# Patient Record
Sex: Female | Born: 1990 | Race: White | Hispanic: No | Marital: Married | Smoking: Never smoker
Health system: Southern US, Community
[De-identification: ages and names within clinical notes are randomized; demographics above are authoritative.]

## PROBLEM LIST (undated history)

## (undated) DIAGNOSIS — N83209 Unspecified ovarian cyst, unspecified side: Secondary | ICD-10-CM

## (undated) DIAGNOSIS — N898 Other specified noninflammatory disorders of vagina: Secondary | ICD-10-CM

## (undated) DIAGNOSIS — Z9289 Personal history of other medical treatment: Secondary | ICD-10-CM

## (undated) DIAGNOSIS — N390 Urinary tract infection, site not specified: Secondary | ICD-10-CM

## (undated) DIAGNOSIS — Z803 Family history of malignant neoplasm of breast: Secondary | ICD-10-CM

## (undated) HISTORY — DX: Unspecified ovarian cyst, unspecified side: N83.209

## (undated) HISTORY — DX: Urinary tract infection, site not specified: N39.0

## (undated) HISTORY — DX: Personal history of other medical treatment: Z92.89

## (undated) HISTORY — DX: Family history of malignant neoplasm of breast: Z80.3

## (undated) HISTORY — DX: Other specified noninflammatory disorders of vagina: N89.8

---
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
## 2013-09-21 ENCOUNTER — Observation Stay: Payer: Self-pay

## 2013-09-25 ENCOUNTER — Inpatient Hospital Stay: Payer: Self-pay | Admitting: Obstetrics & Gynecology

## 2013-09-25 LAB — CBC WITH DIFFERENTIAL/PLATELET
BASOS ABS: 0 10*3/uL (ref 0.0–0.1)
Basophil %: 0.4 %
Eosinophil #: 0.1 10*3/uL (ref 0.0–0.7)
Eosinophil %: 0.6 %
HCT: 40 % (ref 35.0–47.0)
HGB: 13.8 g/dL (ref 12.0–16.0)
Lymphocyte #: 2 10*3/uL (ref 1.0–3.6)
Lymphocyte %: 15.6 %
MCH: 31.5 pg (ref 26.0–34.0)
MCHC: 34.5 g/dL (ref 32.0–36.0)
MCV: 91 fL (ref 80–100)
MONOS PCT: 8.1 %
Monocyte #: 1 x10 3/mm — ABNORMAL HIGH (ref 0.2–0.9)
NEUTROS ABS: 9.6 10*3/uL — AB (ref 1.4–6.5)
Neutrophil %: 75.3 %
PLATELETS: 139 10*3/uL — AB (ref 150–440)
RBC: 4.38 10*6/uL (ref 3.80–5.20)
RDW: 13.3 % (ref 11.5–14.5)
WBC: 12.8 10*3/uL — AB (ref 3.6–11.0)

## 2013-09-25 LAB — GC/CHLAMYDIA PROBE AMP

## 2013-09-26 LAB — HEMATOCRIT: HCT: 32.7 % — ABNORMAL LOW (ref 35.0–47.0)

## 2014-04-04 DIAGNOSIS — Z803 Family history of malignant neoplasm of breast: Secondary | ICD-10-CM

## 2014-04-04 HISTORY — DX: Family history of malignant neoplasm of breast: Z80.3

## 2014-04-30 ENCOUNTER — Emergency Department: Payer: Self-pay | Admitting: Emergency Medicine

## 2014-04-30 LAB — CBC
HCT: 41.9 % (ref 35.0–47.0)
HGB: 13.8 g/dL (ref 12.0–16.0)
MCH: 29.7 pg (ref 26.0–34.0)
MCHC: 32.8 g/dL (ref 32.0–36.0)
MCV: 91 fL (ref 80–100)
Platelet: 195 10*3/uL (ref 150–440)
RBC: 4.62 10*6/uL (ref 3.80–5.20)
RDW: 13.3 % (ref 11.5–14.5)
WBC: 7.1 10*3/uL (ref 3.6–11.0)

## 2014-04-30 LAB — HCG, QUANTITATIVE, PREGNANCY: Beta Hcg, Quant.: 6807 m[IU]/mL — ABNORMAL HIGH

## 2014-06-04 NOTE — L&D Delivery Note (Signed)
VAGINAL DELIVERY NOTE:  Date of Delivery: 04/30/2015 Primary OB: [facility]  Gestational Age/EDD: 2055w3d 05/11/2015, by Other Basis Antepartum complications: none Attending Physician: Annamarie MajorPaul Harris, MD, FACOG Delivery Type: spontaneous vaginal delivery  Anesthesia:   Epidural Laceration: none Episiotomy: none Placenta: manual removal Intrapartum complications: None Estimated Blood Loss: Min, < 100 GBS: neg Procedure Details: Routine Baby: Liveborn female, Apgars 8/9

## 2014-10-12 NOTE — H&P (Signed)
L&D Evaluation:  History:  HPI 24 year old G1 P0 with EDC=10/02/2013 by a LMP=12/26/2012 and c/w a 9wk4d ultrasound,  presents with contractions q6 min and leakage of fluid. Contractions started at work and they seem to have spaced out since coming to the hospital. Baby active. Is schedule for IOL on 4/24 for ACD (4/80%) since she lives in [place] (one hour away). [facility] remarkable for spotting in the first trimester, negative genetic testing, and a normal anatomy scan. LABS: APOS, RI, VI,   Presents with contractions, leaking fluid   Patient's Medical History No Chronic Illness   Patient's Surgical History Reduction mammoplasty   Medications Pre Natal Vitamins   Allergies NKDA   Social History none   Family History Non-Contributory   ROS:  ROS see HPI   Exam:  Vital Signs stable   General no apparent distress   Mental Status clear   Abdomen gravid, non-tender   Estimated Fetal Weight Average for gestational age   Fetal Position cephalic   Pelvic no external lesions, SSE: small amt white discharge. nitrazine negative. fern negative.. 4/90%/-1   Mebranes Intact   FHT normal rate with no decels, CAt 1   Ucx q10-15 min apart   Skin dry   Impression:  Impression reactive NST, IUP at 38 3/7 weeks. Not in labor. No evidence of SROM   Plan:  Plan DC home with labor precautions.   Electronic Signatures: Trinna BalloonGutierrez, Cherylanne Ardelean L (CNM)  (Signed 20-Apr-15 20:09)  Authored: L&D Evaluation   Last Updated: 20-Apr-15 20:09 by Trinna BalloonGutierrez, Nakia Remmers L (CNM)

## 2014-10-12 NOTE — H&P (Signed)
L&D Evaluation:  History Expanded:  HPI 24 year old G1 P0 with EDC=10/02/2013 by a LMP=12/26/2012 and c/w a 9wk4d ultrasound,  presents with contractions. Contractions reg, no spontaneous rupture of membranes or vaginal bleeding. Baby active. Is schedule for IOL on 4/24 for ACD (4/80%) since she lives in [place] (one hour away). [facility] remarkable for spotting in the first trimester, negative genetic testing, and a normal anatomy scan. LABS: APOS, RI, VI,   Gravida 1   Term 0   Blood Type (Maternal) A positive   Maternal Varicella Immune   Rubella Results (Maternal) immune   EDC 02-Oct-2013   Presents with contractions   Patient's Medical History No Chronic Illness   Patient's Surgical History Reduction mammoplasty   Medications Pre Natal Vitamins   Allergies NKDA   Social History none   Family History Non-Contributory   ROS:  ROS see HPI   Exam:  Vital Signs stable   General no apparent distress   Mental Status clear   Chest clear   Heart normal sinus rhythm   Abdomen gravid, non-tender   Estimated Fetal Weight Average for gestational age   Fetal Position cephalic   Back no CVAT   Edema no edema   Pelvic no external lesions, 9 cm   Mebranes Intact   FHT normal rate with no decels, CAt 1   Ucx regular   Skin dry   Impression:  Impression active labor, reactive NST   Plan:  Plan EFM/NST, monitor contractions and for cervical change, fluids   Comments analgesia   Electronic Signatures: Letitia LibraHarris, Dolce Sylvia Paul (MD)  (Signed 24-Apr-15 01:39)  Authored: L&D Evaluation   Last Updated: 24-Apr-15 01:39 by Letitia LibraHarris, Naylin Burkle Paul (MD)

## 2015-04-23 IMAGING — US US OB < 14 WEEKS - US OB TV
1 series · 13 of 28 positions shown · non-contrast
Comparison: None.

CLINICAL DATA: Vaginal bleeding.  Positive for pregnancy.

EXAM:
OBSTETRIC <14 WK US AND TRANSVAGINAL OB US
TECHNIQUE: Both transabdominal and transvaginal ultrasound examinations were
performed for complete evaluation of the gestation as well as the
maternal uterus, adnexal regions, and pelvic cul-de-sac.
Transvaginal technique was performed to assess early pregnancy.

[Series 1: us ob < 14 weeks - us ob tv · 0.18mm/px · 13 of 84 slices shown]
[im 4/84]
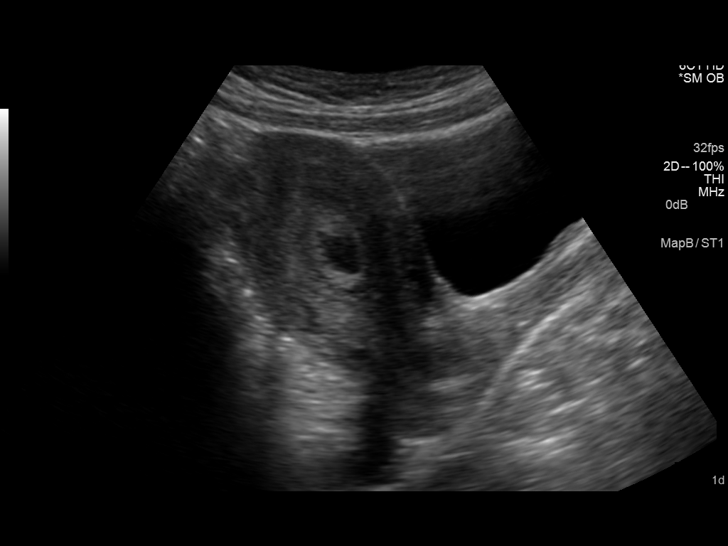
[im 10/84]
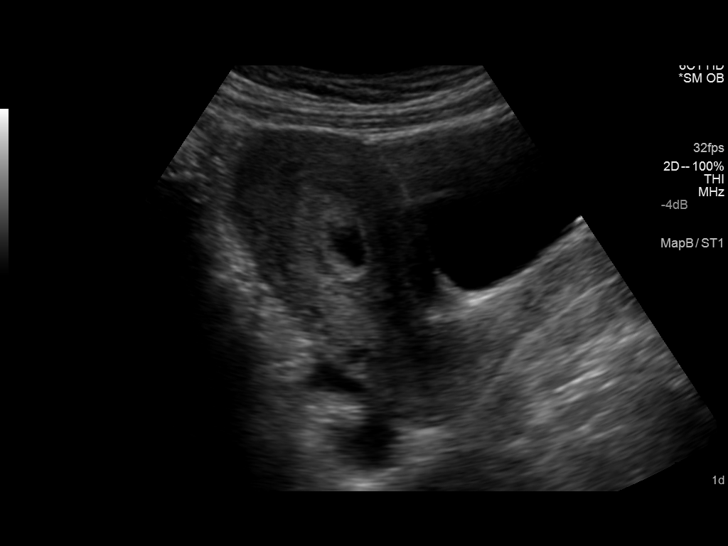
[im 16/84]
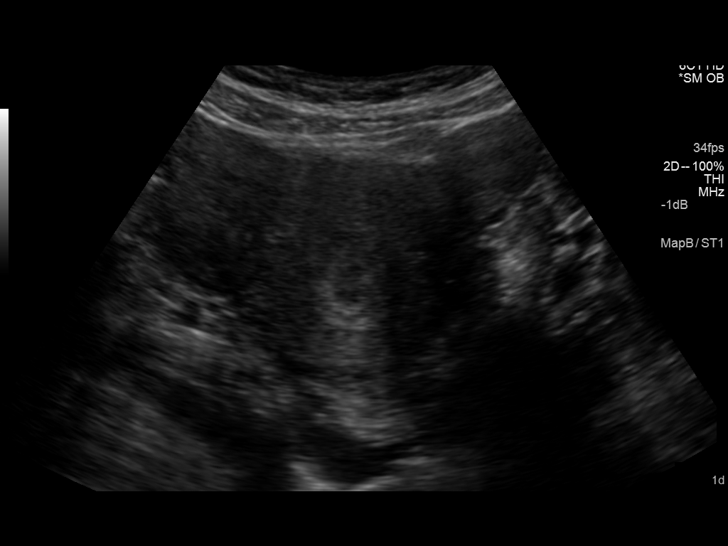
[im 22/84]
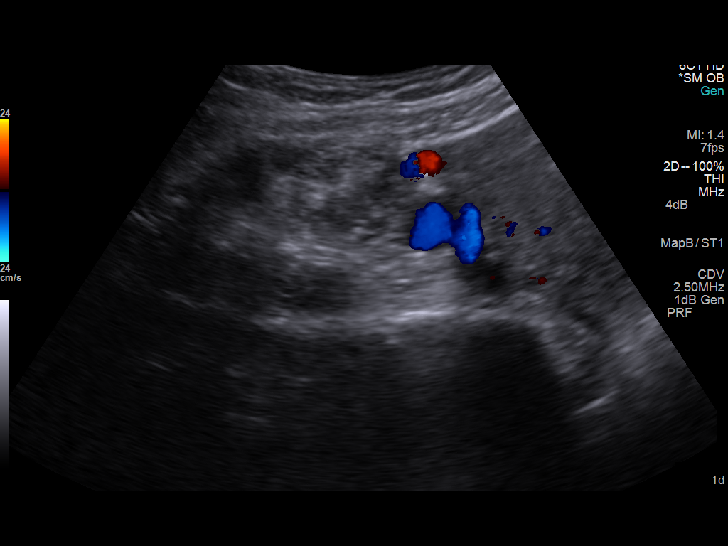
[im 28/84]
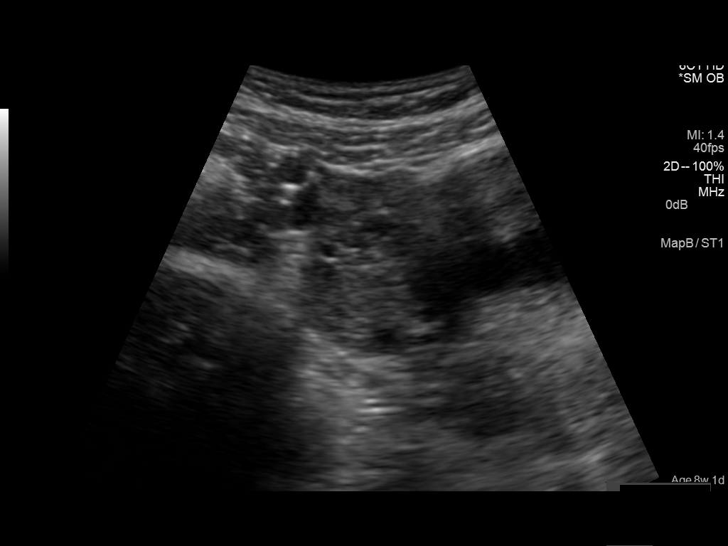
[im 34/84]
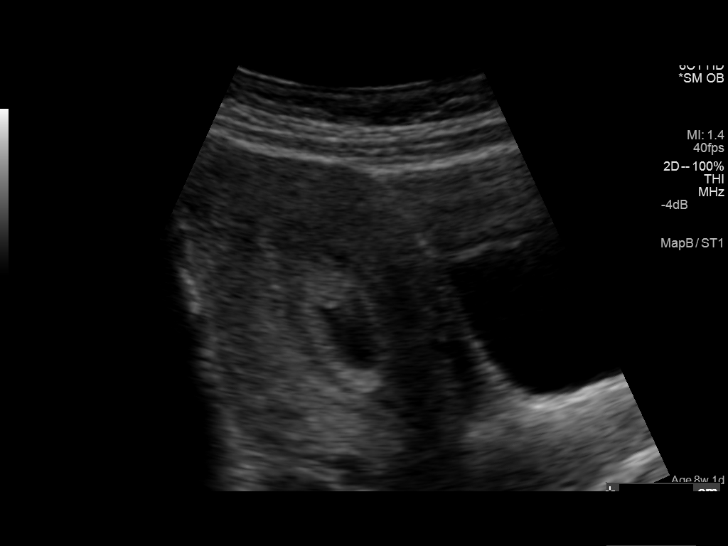
[im 44/84]
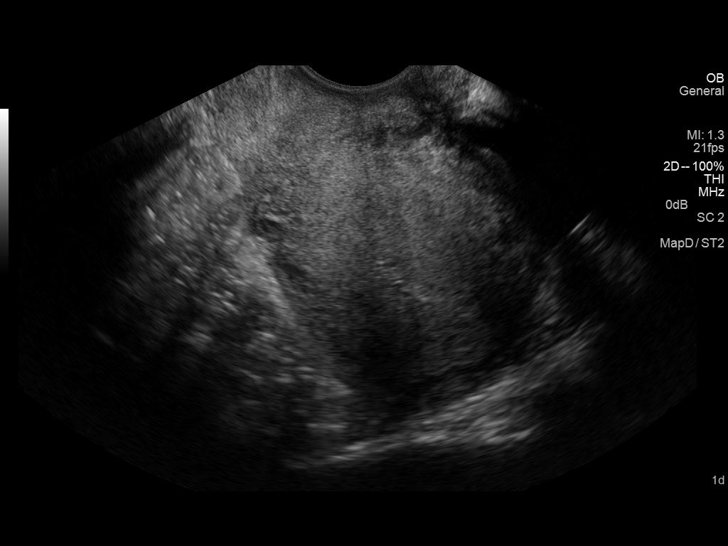
[im 50/84]
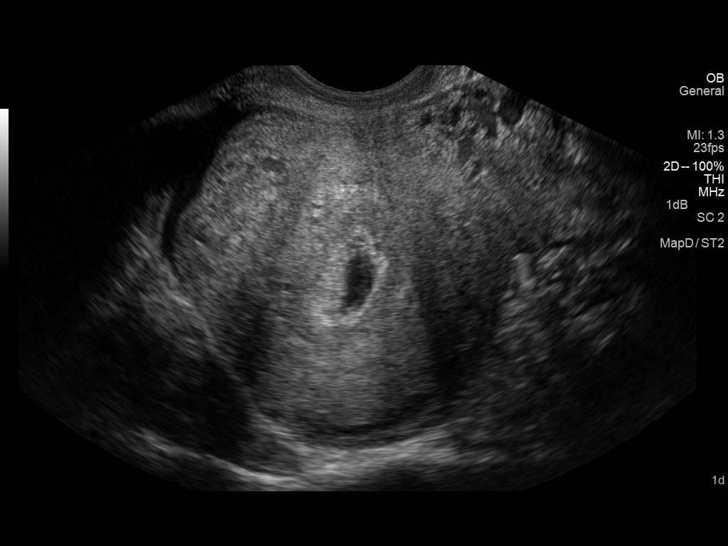
[im 56/84]
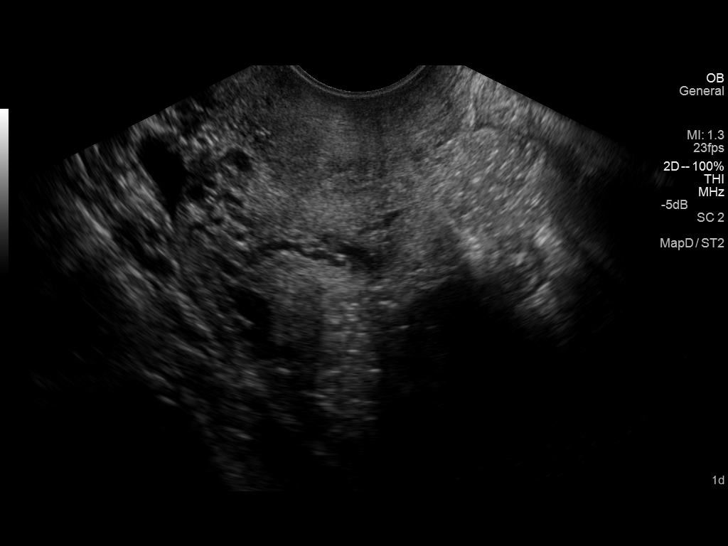
[im 62/84]
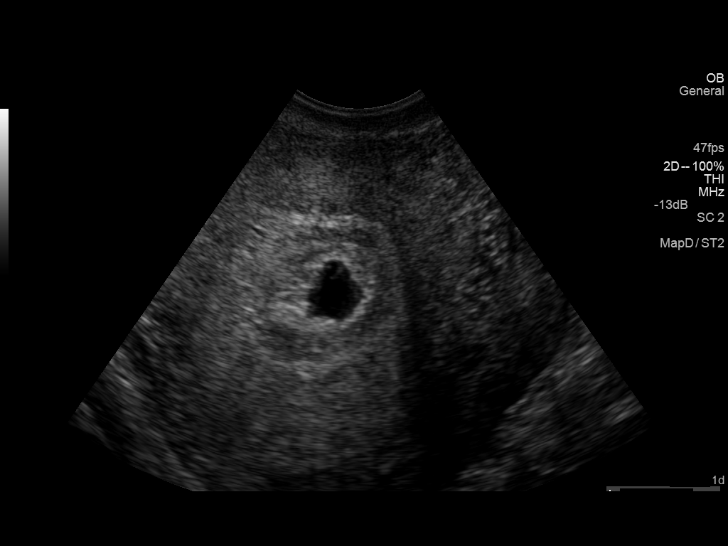
[im 68/84]
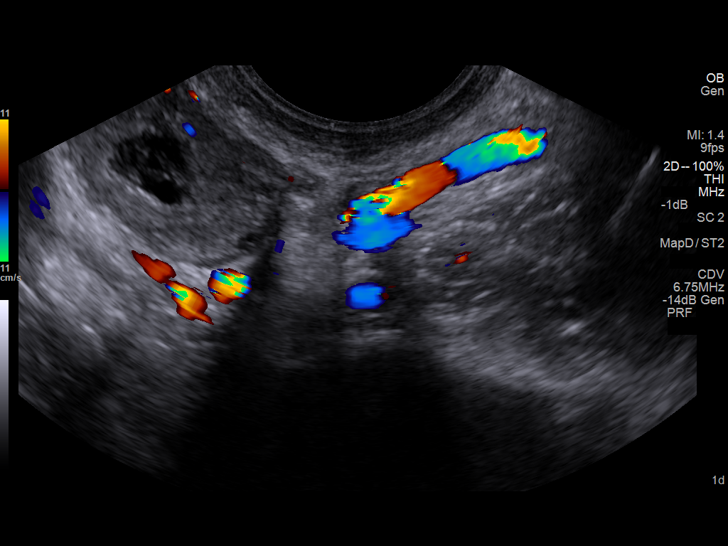
[im 74/84]
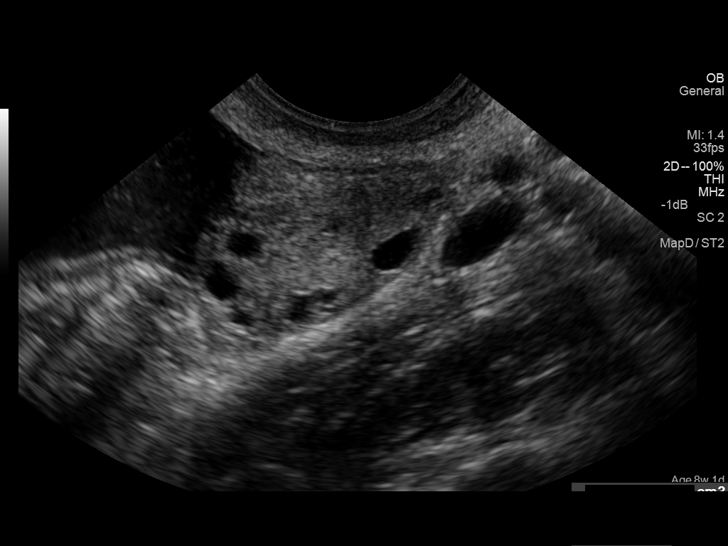
[im 80/84]
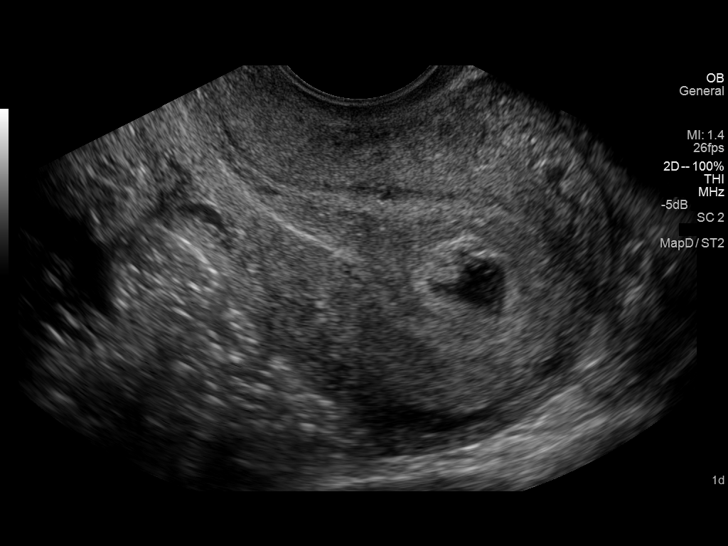

[13 of 28 positions shown; findings below may reference images not displayed]

FINDINGS: Intrauterine gestational sac: Present

Yolk sac:  None

Embryo:  None

MSD:  9.4  mm   5 w   5  d

US EDC: 12/26/2014

Maternal uterus/adnexae: There is an intrauterine gestational sac.
The margins of the gestational sac are irregular. No evidence for an
embryo or yolk sac. Difficult to exclude echogenic debris within the
gestational sac. No evidence for subchorionic hemorrhage. Small
amount of free fluid. Right ovary measures 2.3 x 1.6 x 2.5 cm. Small
hypoechoic structure within the right ovary measures up to 1.1 cm
with some internal echoes. Left ovary measures 2.8 x 1.4 x 2.3 cm
with small follicles.
IMPRESSION: Evidence for an intrauterine gestational sac. Calculated gestational
age is 5 weeks and 5 days. However, the margins of the gestational
sac are irregular which raises concern for the viability of this
pregnancy.

Small amount of free fluid.

## 2015-04-30 ENCOUNTER — Inpatient Hospital Stay: Payer: BLUE CROSS/BLUE SHIELD | Admitting: Anesthesiology

## 2015-04-30 ENCOUNTER — Encounter: Payer: Self-pay | Admitting: *Deleted

## 2015-04-30 ENCOUNTER — Inpatient Hospital Stay
Admission: EM | Admit: 2015-04-30 | Discharge: 2015-05-02 | DRG: 775 | Disposition: A | Discharge status: Home / Self Care | Payer: BLUE CROSS/BLUE SHIELD | Attending: Obstetrics & Gynecology | Admitting: Obstetrics & Gynecology

## 2015-04-30 DIAGNOSIS — Z3A38 38 weeks gestation of pregnancy: Secondary | ICD-10-CM

## 2015-04-30 DIAGNOSIS — Z9889 Other specified postprocedural states: Secondary | ICD-10-CM

## 2015-04-30 LAB — CBC
HEMATOCRIT: 34.3 % — AB (ref 35.0–47.0)
Hemoglobin: 11.1 g/dL — ABNORMAL LOW (ref 12.0–16.0)
MCH: 27.7 pg (ref 26.0–34.0)
MCHC: 32.3 g/dL (ref 32.0–36.0)
MCV: 85.8 fL (ref 80.0–100.0)
PLATELETS: 136 10*3/uL — AB (ref 150–440)
RBC: 3.99 MIL/uL (ref 3.80–5.20)
RDW: 14.5 % (ref 11.5–14.5)
WBC: 13.9 10*3/uL — AB (ref 3.6–11.0)

## 2015-04-30 LAB — ABO/RH: ABO/RH(D): A POS

## 2015-04-30 MED ORDER — BUPIVACAINE HCL (PF) 0.25 % IJ SOLN
INTRAMUSCULAR | Status: DC | PRN
  Administered 2015-04-30: 3 mL via EPIDURAL

## 2015-04-30 MED ORDER — OXYTOCIN 40 UNITS IN LACTATED RINGERS INFUSION - SIMPLE MED
62.5000 mL/h | INTRAVENOUS | Status: DC
  Administered 2015-04-30: 62.5 mL/h via INTRAVENOUS
  Administered 2015-04-30: 40 [IU] via INTRAVENOUS

## 2015-04-30 MED ORDER — MISOPROSTOL 200 MCG PO TABS
ORAL_TABLET | ORAL | Status: AC
  Filled 2015-04-30: qty 4

## 2015-04-30 MED ORDER — FENTANYL 2.5 MCG/ML W/ROPIVACAINE 0.2% IN NS 100 ML EPIDURAL INFUSION (ARMC-ANES)
EPIDURAL | Status: AC
  Administered 2015-04-30: 10 mL/h via EPIDURAL
  Filled 2015-04-30: qty 100

## 2015-04-30 MED ORDER — OXYTOCIN 10 UNIT/ML IJ SOLN
INTRAMUSCULAR | Status: AC
  Filled 2015-04-30: qty 2

## 2015-04-30 MED ORDER — BUTORPHANOL TARTRATE 1 MG/ML IJ SOLN: 1.0000 mg | INTRAMUSCULAR | Status: DC | PRN

## 2015-04-30 MED ORDER — LACTATED RINGERS IV SOLN
INTRAVENOUS | Status: DC
Start: 2015-04-30 — End: 2015-05-01

## 2015-04-30 MED ORDER — AMMONIA AROMATIC IN INHA
RESPIRATORY_TRACT | Status: AC
  Filled 2015-04-30: qty 10

## 2015-04-30 MED ORDER — ONDANSETRON HCL 4 MG/2ML IJ SOLN: 4.0000 mg | Freq: Four times a day (QID) | INTRAMUSCULAR | Status: DC | PRN

## 2015-04-30 MED ORDER — ACETAMINOPHEN 325 MG PO TABS: 650.0000 mg | ORAL_TABLET | ORAL | Status: DC | PRN

## 2015-04-30 MED ORDER — OXYTOCIN 40 UNITS IN LACTATED RINGERS INFUSION - SIMPLE MED
INTRAVENOUS | Status: AC
  Administered 2015-04-30: 40 [IU] via INTRAVENOUS
  Filled 2015-04-30: qty 1000

## 2015-04-30 MED ORDER — LIDOCAINE HCL (PF) 1 % IJ SOLN
INTRAMUSCULAR | Status: AC
  Filled 2015-04-30: qty 30

## 2015-04-30 MED ORDER — LACTATED RINGERS IV SOLN
500.0000 mL | INTRAVENOUS | Status: DC | PRN
  Administered 2015-04-30: 500 mL via INTRAVENOUS

## 2015-04-30 MED ORDER — OXYTOCIN BOLUS FROM INFUSION: 500.0000 mL | INTRAVENOUS | Status: DC

## 2015-04-30 NOTE — Anesthesia Preprocedure Evaluation (Signed)
Anesthesia Evaluation  Patient identified by MRN, date of birth, ID band Patient awake    Reviewed: Allergy & Precautions, NPO status , Patient's Chart, lab work & pertinent test results, reviewed documented beta blocker date and time   Airway Mallampati: II  TM Distance: >3 FB     Dental  (+) Chipped   Pulmonary           Cardiovascular      Neuro/Psych    GI/Hepatic   Endo/Other    Renal/GU      Musculoskeletal   Abdominal   Peds  Hematology   Anesthesia Other Findings   Reproductive/Obstetrics                             Anesthesia Physical Anesthesia Plan  ASA: II  Anesthesia Plan: Epidural   Post-op Pain Management:    Induction:   Airway Management Planned:   Additional Equipment:   Intra-op Plan:   Post-operative Plan:   Informed Consent: I have reviewed the patients History and Physical, chart, labs and discussed the procedure including the risks, benefits and alternatives for the proposed anesthesia with the patient or authorized representative who has indicated his/her understanding and acceptance.     Plan Discussed with: CRNA  Anesthesia Plan Comments:         Anesthesia Quick Evaluation  

## 2015-04-30 NOTE — OB Triage Note (Signed)
Recvd to OBS 2 per wheelchair from ED c/o contractions for 1.5 hours getting stronger with time.  Was seen in the office on 04/27/15 and was 5cm.  Changed to gown and to bed.  EFM applied.  Plan of care discussed, oriented to room.  Agrees with POC and verbalizes understanding.

## 2015-04-30 NOTE — Discharge Instructions (Signed)

## 2015-04-30 NOTE — H&P (Signed)
Obstetrics Admission History & Physical   Contractions   HPI:  24 y.o. G3P1011 @ 5267w3d (05/11/2015, by Other Basis). Admitted on 04/30/2015:   Patient 42ctive Problem List   Diagnosis Date Noted  . Normal labor and delivery 04/30/2015  . Labor and delivery indication for care or intervention 04/30/2015     Presents for painful ctxs., no ROM or VB.  PNC noted for ACD recently (5 cm last check).  [facility]  PMHx: History reviewed. No pertinent past medical history. PSHx:  Past Surgical History  Procedure Laterality Date  . Breast reduction surgery Bilateral 2009   Medications:  No prescriptions prior to admission   Allergies: has No Known Allergies. OBHx:  OB History  Gravida Para Term Preterm AB SAB TAB Ectopic Multiple Living  3 1 1  1     1     # Outcome Date GA Lbr Len/2nd Weight Sex Delivery Anes PTL Lv  3 Current           2 AB           1 Term              ZOX:[id number]:Negative/unremarkable except as detailed in HPI. Soc Hx: Pregnancy welcomed  Objective:   Filed Vitals:   04/30/15 1725 04/30/15 1930  BP: 142/89 135/89  Pulse: 98 106  Temp: 98.6 F (37 C)   Resp: 18 16   General: Well nourished, well developed female in no acute distress.  Skin: Warm and dry.  Cardiovascular:Regular rate and rhythm. Respiratory: Clear to auscultation bilateral. Normal respiratory effort Abdomen: moderate Neuro/Psych: Normal mood and affect.   Pelvic exam: is not limited by body habitus EGBUS: within normal limits Vagina: within normal limits and with normal mucosa blood in the vault Cervix: 7/90/-1 Uterus: Spontaneous uterine activity  Adnexa: not evaluated  EFM:FHR: 140 bpm, variability: moderate,  accelerations:  Present,  decelerations:  Absent Toco: Frequency: Every 3-4 minutes   Perinatal info:  Blood type: A positive Rubella- Immune Varicella -Immune TDaP Given during third trimester of this pregnancy RPR NR / HIV Neg/ HBsAg Neg    Assessment & Plan:   24 y.o. [id number] @ 5767w3d, Admitted on 04/30/2015: Active Labor.    Admit for labor, Observe for cervical change, Fetal Wellbeing Reassuring, Epidural when ready and AROM when Appropriate  AROM clear  Annamarie MajorPaul Chidiebere Wynn, MD

## 2015-04-30 NOTE — Discharge Summary (Signed)
Obstetrical Discharge Summary  Date of Admission: 04/30/2015 Date of Discharge: @dischargedt @  Discharge Diagnosis: Term Pregnancy-delivered Primary OB:  [facility]   Gestational Age at Delivery: 2442w3d  Antepartum complications: none Date of Delivery: 04/30/15  Delivered By: Tiburcio PeaHarris Delivery Type: spontaneous vaginal delivery Intrapartum complications/course: None Anesthesia: epidural Placenta: manual removal Laceration: none Episiotomy: none Live born female  Birth Weight:   APGAR: ,    Post partum course: Since the delivery, patient has tolerate activity, diet, and daily functions without difficulty or complication.  Min lochia.  No breast concerns at this time.  No signs of depression currently.   Postpartum Exam:General appearance: alert and no distress GI: soft, non-tender; bowel sounds normal; no masses,  no organomegaly and Fundus firm Extremities: extremities normal, atraumatic, no cyanosis or edema  Disposition: home with infant Rh Immune globulin given: not applicable Rubella vaccine given: not applicable Varicella vaccine given: not applicable Tdap vaccine given in AP or PP setting: given during prenatal care Flu vaccine given in AP or PP setting: prior to discharge Contraception: NuvaRing vaginal insert  Prenatal Labs: A POS//Rubella Immune//RPR negative//HIV negative/HepB Surface Ag negative//plans to bottle feed  Plan:  Karina FetterKylie Garcia was discharged to home in good condition. [facility] weeks  Discharge Medications:   Medication List    TAKE these medications        ibuprofen 600 MG tablet  Commonly known as:  ADVIL,MOTRIN  Take 1 tablet (600 mg total) by mouth every 6 (six) hours.        Follow-up arrangements:  @DISCHARGEFOLLOWUP @   No future appointments.

## 2015-04-30 NOTE — Anesthesia Procedure Notes (Signed)
Epidural Patient location during procedure: OB  Staffing Anesthesiologist: Berdine AddisonHOMAS, Karina Garcia Performed by: anesthesiologist   Preanesthetic Checklist Completed: patient identified, site marked, surgical consent, pre-op evaluation, timeout performed, IV checked, risks and benefits discussed and monitors and equipment checked  Epidural Patient position: sitting Prep: Betadine Patient monitoring: heart rate, continuous pulse ox and blood pressure Approach: midline Location: L4-L5 Injection technique: LOR saline  Needle:  Needle type: Tuohy  Needle gauge: 18 G Needle length: 9 cm and 9 Catheter type: closed end flexible Catheter size: 20 Guage Test dose: negative and 1.5% lidocaine with Epi 1:200 K  Assessment Sensory level: T10 Events: blood not aspirated, injection not painful, no injection resistance, negative IV test and no paresthesia  Additional Notes   Patient tolerated the insertion well without complications. 1949 epi cath. 1951 test. 1957 bolus. 2000 infusion.Reason for block:procedure for pain

## 2015-05-01 LAB — CBC
HCT: 33.3 % — ABNORMAL LOW (ref 35.0–47.0)
Hemoglobin: 10.9 g/dL — ABNORMAL LOW (ref 12.0–16.0)
MCH: 27.7 pg (ref 26.0–34.0)
MCHC: 32.7 g/dL (ref 32.0–36.0)
MCV: 84.7 fL (ref 80.0–100.0)
PLATELETS: 123 10*3/uL — AB (ref 150–440)
RBC: 3.93 MIL/uL (ref 3.80–5.20)
RDW: 14.1 % (ref 11.5–14.5)
WBC: 13.9 10*3/uL — AB (ref 3.6–11.0)

## 2015-05-01 MED ORDER — ONDANSETRON HCL 4 MG PO TABS: 4.0000 mg | ORAL_TABLET | ORAL | Status: DC | PRN

## 2015-05-01 MED ORDER — SODIUM CHLORIDE 0.9 % IJ SOLN: 3.0000 mL | Freq: Two times a day (BID) | INTRAMUSCULAR | Status: DC

## 2015-05-01 MED ORDER — SODIUM CHLORIDE 0.9 % IV SOLN: 250.0000 mL | INTRAVENOUS | Status: DC | PRN

## 2015-05-01 MED ORDER — DIBUCAINE 1 % RE OINT: 1.0000 "application " | TOPICAL_OINTMENT | RECTAL | Status: DC | PRN

## 2015-05-01 MED ORDER — DOCUSATE SODIUM 100 MG PO CAPS
100.0000 mg | ORAL_CAPSULE | Freq: Every day | ORAL | Status: DC
  Administered 2015-05-01 – 2015-05-02 (×2): 100 mg via ORAL
  Filled 2015-05-01 (×2): qty 1

## 2015-05-01 MED ORDER — PRENATAL MULTIVITAMIN CH
1.0000 | ORAL_TABLET | Freq: Every day | ORAL | Status: DC
  Administered 2015-05-01 – 2015-05-02 (×2): 1 via ORAL
  Filled 2015-05-01 (×2): qty 1

## 2015-05-01 MED ORDER — OXYCODONE-ACETAMINOPHEN 5-325 MG PO TABS: 1.0000 | ORAL_TABLET | ORAL | Status: DC | PRN

## 2015-05-01 MED ORDER — SODIUM CHLORIDE 0.9 % IJ SOLN: 3.0000 mL | INTRAMUSCULAR | Status: DC | PRN

## 2015-05-01 MED ORDER — WITCH HAZEL-GLYCERIN EX PADS: 1.0000 "application " | MEDICATED_PAD | CUTANEOUS | Status: DC | PRN

## 2015-05-01 MED ORDER — LANOLIN HYDROUS EX OINT: TOPICAL_OINTMENT | CUTANEOUS | Status: DC | PRN

## 2015-05-01 MED ORDER — SIMETHICONE 80 MG PO CHEW: 80.0000 mg | CHEWABLE_TABLET | ORAL | Status: DC | PRN

## 2015-05-01 MED ORDER — ONDANSETRON HCL 4 MG/2ML IJ SOLN: 4.0000 mg | INTRAMUSCULAR | Status: DC | PRN

## 2015-05-01 MED ORDER — ZOLPIDEM TARTRATE 5 MG PO TABS: 5.0000 mg | ORAL_TABLET | Freq: Every evening | ORAL | Status: DC | PRN

## 2015-05-01 MED ORDER — OXYTOCIN 40 UNITS IN LACTATED RINGERS INFUSION - SIMPLE MED: 62.5000 mL/h | INTRAVENOUS | Status: DC | PRN

## 2015-05-01 MED ORDER — OXYCODONE-ACETAMINOPHEN 5-325 MG PO TABS: 2.0000 | ORAL_TABLET | ORAL | Status: DC | PRN

## 2015-05-01 MED ORDER — IBUPROFEN 600 MG PO TABS
600.0000 mg | ORAL_TABLET | Freq: Four times a day (QID) | ORAL | Status: DC
  Administered 2015-05-01 – 2015-05-02 (×6): 600 mg via ORAL
  Filled 2015-05-01 (×6): qty 1

## 2015-05-01 MED ORDER — BENZOCAINE-MENTHOL 20-0.5 % EX AERO: 1.0000 "application " | INHALATION_SPRAY | CUTANEOUS | Status: DC | PRN

## 2015-05-01 MED ORDER — DIPHENHYDRAMINE HCL 25 MG PO CAPS: 25.0000 mg | ORAL_CAPSULE | Freq: Four times a day (QID) | ORAL | Status: DC | PRN

## 2015-05-01 MED ORDER — INFLUENZA VAC SPLIT QUAD 0.5 ML IM SUSY
0.5000 mL | PREFILLED_SYRINGE | INTRAMUSCULAR | Status: AC
  Filled 2015-05-01: qty 0.5

## 2015-05-01 MED ORDER — SENNOSIDES-DOCUSATE SODIUM 8.6-50 MG PO TABS
2.0000 | ORAL_TABLET | ORAL | Status: DC
  Administered 2015-05-01: 2 via ORAL
  Filled 2015-05-01: qty 2

## 2015-05-01 MED ORDER — ACETAMINOPHEN 325 MG PO TABS: 650.0000 mg | ORAL_TABLET | ORAL | Status: DC | PRN

## 2015-05-01 NOTE — Progress Notes (Signed)
Post Partum Day 1 (11 hours pp) Subjective: no complaints, voiding and tolerating PO  Objective: Blood pressure 121/86, pulse 53, temperature 98.3 F (36.8 C), temperature source Oral, resp. rate 18, height 5\' 3"  (1.6 m), weight 151 lb (68.493 kg), SpO2 100 %, unknown if currently breastfeeding.  Physical Exam:  General: alert, cooperative and no distress Lochia: appropriate Uterine Fundus: firm/ at U/ slightly deviated to right/NT   DVT Evaluation: No evidence of DVT seen on physical exam.   Recent Labs  04/30/15 1820 05/01/15 0602  HGB 11.1* 10.9*  HCT 34.3* 33.3*  WBC 13.9* 13.9*  PLT 136* 123*    Assessment/Plan: Stable PPD #1 Plan for discharge tomorrow  Bottle/ Nuvaring TDAP UTD A POS/ RI/ VI    LOS: 1 day   Karina Garcia 05/01/2015, 9:21 AM

## 2015-05-01 NOTE — Anesthesia Postprocedure Evaluation (Signed)
Anesthesia Post Note  Patient: Karina Garcia  Procedure(s) Performed: * No procedures listed *  Anesthesia Post Evaluation  Last Vitals:  Filed Vitals:   05/01/15 1304 05/01/15 1650  BP: 115/76 115/71  Pulse: 77 91  Temp: 36.8 C 36.9 C  Resp: 18 18    Last Pain:  Filed Vitals:   05/01/15 1654  PainSc: 1                  Marthena Whitmyer S

## 2015-05-01 NOTE — Plan of Care (Signed)
Problem: Life Cycle: Goal: Risk for postpartum hemorrhage will decrease Outcome: Progressing Small Lochial Flow. Fundus is Firm and Mid-Line  Problem: Nutritional: Goal: Dietary intake will improve Outcome: Progressing Tolerating Regular Diet.

## 2015-05-02 LAB — TYPE AND SCREEN
ABO/RH(D): A POS
Antibody Screen: NEGATIVE

## 2015-05-02 LAB — RPR: RPR: NONREACTIVE

## 2015-05-02 MED ORDER — IBUPROFEN 600 MG PO TABS: 600.0000 mg | ORAL_TABLET | Freq: Four times a day (QID) | ORAL | Status: AC

## 2015-05-02 MED ORDER — INFLUENZA VAC SPLIT QUAD 0.5 ML IM SUSY
0.5000 mL | PREFILLED_SYRINGE | INTRAMUSCULAR | Status: AC | PRN
  Administered 2015-05-02: 0.5 mL via INTRAMUSCULAR

## 2015-05-02 NOTE — Progress Notes (Signed)
Patient understands all discharge instructions and the need to make follow up appointments. Patient discharge via wheelchair with auxillary. 

## 2016-04-20 LAB — HM PAP SMEAR: HM PAP: NEGATIVE

## 2017-07-15 ENCOUNTER — Ambulatory Visit: Payer: Self-pay | Admitting: Maternal Newborn

## 2017-09-27 ENCOUNTER — Ambulatory Visit: Payer: Self-pay | Admitting: Maternal Newborn

## 2020-05-31 ENCOUNTER — Ambulatory Visit (INDEPENDENT_AMBULATORY_CARE_PROVIDER_SITE_OTHER): Payer: BLUE CROSS/BLUE SHIELD | Admitting: Obstetrics and Gynecology

## 2020-05-31 ENCOUNTER — Other Ambulatory Visit: Payer: Self-pay

## 2020-05-31 ENCOUNTER — Encounter: Payer: Self-pay | Admitting: Obstetrics and Gynecology

## 2020-05-31 VITALS — BP 118/72 | Ht 63.0 in | Wt 168.0 lb

## 2020-05-31 DIAGNOSIS — Z1589 Genetic susceptibility to other disease: Secondary | ICD-10-CM | POA: Diagnosis not present

## 2020-05-31 DIAGNOSIS — Z1509 Genetic susceptibility to other malignant neoplasm: Secondary | ICD-10-CM | POA: Diagnosis not present

## 2020-05-31 DIAGNOSIS — Z1501 Genetic susceptibility to malignant neoplasm of breast: Secondary | ICD-10-CM

## 2020-05-31 DIAGNOSIS — R1031 Right lower quadrant pain: Secondary | ICD-10-CM | POA: Diagnosis not present

## 2020-06-01 DIAGNOSIS — Z1501 Genetic susceptibility to malignant neoplasm of breast: Secondary | ICD-10-CM | POA: Insufficient documentation

## 2020-06-01 DIAGNOSIS — Z1509 Genetic susceptibility to other malignant neoplasm: Secondary | ICD-10-CM | POA: Insufficient documentation

## 2020-06-01 NOTE — Progress Notes (Signed)
Gynecology Pelvic Pain Evaluation   Chief Complaint:  Chief Complaint  Patient presents with  . Mass    Lower RT ABD x 2wks. RM 5    History of Present Illness:   Patient is a 29 y.o. [id number] who LMP was Patient's last menstrual period was 05/23/2020., presents today for a problem visit.  She complains of pelvic pain, right mid abdomen, periumbilical present since at least May,. Patient with uncomplicated vaginal delivery 07/13/2019.    Her pain is localized to the RLQ area, described as intermittent, began several months ago and its severity is described as mild. The pain radiates to the  Non-radiating. She has these associated symptoms which include abdominal pain. No nausea or emesis.  She denies pain related to menstrual cycle, denies dyspareunia.  No currently on any hormonal contraception.  Patient negative BRCA testing but  Previous evaluation: none.  Uterus and ovaries normal on ultrasound during recent pregnancy.  The patient report feeling a mass periumbilically as well, this is deep not superficial to her.    Review of Systems: Review of Systems  Gastrointestinal: Positive for abdominal pain. Negative for blood in stool, constipation, diarrhea, heartburn, melena, nausea and vomiting.  Genitourinary: Negative.     Past Medical History:  Patient Active Problem List   Diagnosis Date Noted  . Postpartum care following vaginal delivery 05/01/2015  . Normal labor and delivery 04/30/2015  . Labor and delivery indication for care or intervention 04/30/2015    Past Surgical History:  Past Surgical History:  Procedure Laterality Date  . BREAST REDUCTION SURGERY Bilateral 2009    Gynecologic History:  Patient's last menstrual period was 05/23/2020.   Obstetric History: [id number]  Family History:  Family History  Problem Relation Age of Onset  . Cancer Maternal Grandmother 60       RECTAL  . Breast cancer Paternal Grandmother 78  . Breast cancer Other 60       3  recorrences  . Breast cancer Other   . Cancer Other 41    Social History:  Social History   Socioeconomic History  . Marital status: Married    Spouse name: JACK  . Number of children: 2  . Years of education: 63  . Highest education level: Not on file  Occupational History  . Occupation: CUSTOMER SERVICE  Tobacco Use  . Smoking status: Never Smoker  . Smokeless tobacco: Never Used  Vaping Use  . Vaping Use: Never used  Substance and Sexual Activity  . Alcohol use: No  . Drug use: No  . Sexual activity: Yes    Birth control/protection: None  Other Topics Concern  . Not on file  Social History Narrative  . Not on file   Social Determinants of Health   Financial Resource Strain: Not on file  Food Insecurity: Not on file  Transportation Needs: Not on file  Physical Activity: Not on file  Stress: Not on file  Social Connections: Not on file  Intimate Partner Violence: Not on file    Allergies:  No Known Allergies  Medications: Prior to Admission medications   Medication Sig Start Date End Date Taking? Authorizing Provider  clindamycin-benzoyl peroxide (BENZACLIN) gel 1 application 7/86/75  Yes [provider]  ibuprofen (ADVIL,MOTRIN) 600 MG tablet Take 1 tablet (600 mg total) by mouth every 6 (six) hours. Patient not taking: Reported on 05/31/2020 05/02/15   Malachy Mood, MD    Physical Exam Vitals: Blood pressure 118/72, height '5\' 3"'  (1.6 m),  weight 168 lb (76.2 kg), last menstrual period 05/23/2020, unknown if currently breastfeeding.  General: NAD HEENT: normocephalic, anicteric Pulmonary: No increased work of breathing Abdomen: NABS, soft, non-tender, non-distended.  Umbilicus without lesions.  No hepatomegaly, splenomegaly or masses palpable. No evidence of hernia.  There is ecchymosis noted on the skin corresponding to fingers where the patient has been self palpating.  The area of concern discomfort is just inferior and to the right of the  umbilicus.  lymphadenopathy Extremities: no edema, erythema, or tenderness Neurologic: Grossly intact Psychiatric: mood appropriate, affect full  Female chaperone present for pelvic portion of the physical exam  Assessment: 29 y.o. [id number] with abdominal pain  Problem List Items Addressed This Visit   None   Visit Diagnoses    Right lower quadrant pain    -  Primary   Relevant Orders   US Transvaginal Non-OB   Monoallelic mutation of ATM gene       Relevant Orders   US Transvaginal Non-OB       1) We discussed the possible etiologies for pelvic pain in women.  Gynecologic causes may include endometriosis, adenomyosis, pelvic inflammatory disease (PID), ovarian cysts, ovarian or tubal torsion, and in rare case gynecologic malignancy such as cervical, uterine, or ovarian cancer.  In addition thee possibility of non-gynecologic etiologies such as urinary or GI tract pathology or disordered, as well as musculoskeletal problems.  The goal is to complete a basic work up in hopes of identifying the underlying cause which in turn will dictate treatment.  In the meantime supportive measures such as localized heat, and NSAIDs are reasonable first steps.     - Prescription drug database was not reviewed, UDS was not ordered - Transvaginal ultrasound ordered - Blood work obtained today No  - Cervical cultures No   2) Return in about 1 week (around 06/07/2020) for 1-2 week TVUS and follow up.   Malachy Mood, MD, [facility], [facility] 06/01/2020, 1:35 PM

## 2020-06-17 ENCOUNTER — Ambulatory Visit: Payer: BLUE CROSS/BLUE SHIELD

## 2020-06-17 ENCOUNTER — Ambulatory Visit: Payer: BLUE CROSS/BLUE SHIELD | Admitting: Obstetrics and Gynecology

## 2020-07-01 ENCOUNTER — Ambulatory Visit: Payer: BLUE CROSS/BLUE SHIELD | Admitting: Obstetrics and Gynecology

## 2020-07-01 ENCOUNTER — Ambulatory Visit: Payer: BLUE CROSS/BLUE SHIELD
